# Patient Record
Sex: Male | Born: 1970 | Race: Black or African American | Hispanic: No | Marital: Married
Health system: Southern US, Community
[De-identification: ages and names within clinical notes are randomized; demographics above are authoritative.]

---

## 2003-03-17 ENCOUNTER — Emergency Department (HOSPITAL_COMMUNITY): Admission: EM | Admit: 2003-03-17 | Discharge: 2003-03-17 | Payer: Self-pay | Admitting: Emergency Medicine

## 2013-11-06 ENCOUNTER — Other Ambulatory Visit: Payer: Self-pay | Admitting: Family Medicine

## 2013-11-06 DIAGNOSIS — R109 Unspecified abdominal pain: Secondary | ICD-10-CM

## 2013-11-12 ENCOUNTER — Other Ambulatory Visit: Payer: Self-pay

## 2013-11-14 ENCOUNTER — Ambulatory Visit
Admission: RE | Admit: 2013-11-14 | Discharge: 2013-11-14 | Disposition: A | Discharge status: Home / Self Care | Payer: Federal, State, Local not specified - PPO | Source: Ambulatory Visit | Attending: Family Medicine | Admitting: Family Medicine

## 2013-11-14 DIAGNOSIS — R109 Unspecified abdominal pain: Secondary | ICD-10-CM

## 2015-08-02 ENCOUNTER — Other Ambulatory Visit: Payer: Self-pay | Admitting: Gastroenterology

## 2015-08-02 DIAGNOSIS — R1011 Right upper quadrant pain: Secondary | ICD-10-CM

## 2015-08-16 ENCOUNTER — Ambulatory Visit
Admission: RE | Admit: 2015-08-16 | Discharge: 2015-08-16 | Disposition: A | Discharge status: Home / Self Care | Payer: Federal, State, Local not specified - PPO | Source: Ambulatory Visit | Attending: Gastroenterology | Admitting: Gastroenterology

## 2015-08-16 DIAGNOSIS — R1011 Right upper quadrant pain: Secondary | ICD-10-CM

## 2015-08-16 DIAGNOSIS — K760 Fatty (change of) liver, not elsewhere classified: Secondary | ICD-10-CM | POA: Diagnosis not present

## 2015-08-16 MED ORDER — GADOBENATE DIMEGLUMINE 529 MG/ML IV SOLN
20.0000 mL | Freq: Once | INTRAVENOUS | Status: AC | PRN
  Administered 2015-08-16: 20 mL via INTRAVENOUS

## 2015-09-16 DIAGNOSIS — Z111 Encounter for screening for respiratory tuberculosis: Secondary | ICD-10-CM | POA: Diagnosis not present

## 2015-09-30 DIAGNOSIS — R1011 Right upper quadrant pain: Secondary | ICD-10-CM | POA: Diagnosis not present

## 2015-10-20 DIAGNOSIS — K080 Exfoliation of teeth due to systemic causes: Secondary | ICD-10-CM | POA: Diagnosis not present

## 2015-12-13 DIAGNOSIS — M25562 Pain in left knee: Secondary | ICD-10-CM | POA: Diagnosis not present

## 2015-12-13 DIAGNOSIS — M25561 Pain in right knee: Secondary | ICD-10-CM | POA: Diagnosis not present

## 2015-12-13 DIAGNOSIS — M25572 Pain in left ankle and joints of left foot: Secondary | ICD-10-CM | POA: Diagnosis not present

## 2016-07-05 DIAGNOSIS — E7800 Pure hypercholesterolemia, unspecified: Secondary | ICD-10-CM | POA: Diagnosis not present

## 2016-07-05 DIAGNOSIS — K219 Gastro-esophageal reflux disease without esophagitis: Secondary | ICD-10-CM | POA: Diagnosis not present

## 2016-07-05 DIAGNOSIS — Z0000 Encounter for general adult medical examination without abnormal findings: Secondary | ICD-10-CM | POA: Diagnosis not present

## 2017-01-16 DIAGNOSIS — K080 Exfoliation of teeth due to systemic causes: Secondary | ICD-10-CM | POA: Diagnosis not present

## 2017-01-26 DIAGNOSIS — E7800 Pure hypercholesterolemia, unspecified: Secondary | ICD-10-CM | POA: Diagnosis not present

## 2017-04-20 DIAGNOSIS — H527 Unspecified disorder of refraction: Secondary | ICD-10-CM | POA: Diagnosis not present

## 2017-05-11 DIAGNOSIS — E7800 Pure hypercholesterolemia, unspecified: Secondary | ICD-10-CM | POA: Diagnosis not present

## 2017-07-27 DIAGNOSIS — Z0000 Encounter for general adult medical examination without abnormal findings: Secondary | ICD-10-CM | POA: Diagnosis not present

## 2017-07-27 DIAGNOSIS — E7800 Pure hypercholesterolemia, unspecified: Secondary | ICD-10-CM | POA: Diagnosis not present

## 2017-07-27 DIAGNOSIS — Z23 Encounter for immunization: Secondary | ICD-10-CM | POA: Diagnosis not present

## 2017-08-15 DIAGNOSIS — Z01818 Encounter for other preprocedural examination: Secondary | ICD-10-CM | POA: Diagnosis not present

## 2017-08-15 DIAGNOSIS — Z1211 Encounter for screening for malignant neoplasm of colon: Secondary | ICD-10-CM | POA: Diagnosis not present

## 2017-08-15 DIAGNOSIS — Z800 Family history of malignant neoplasm of digestive organs: Secondary | ICD-10-CM | POA: Diagnosis not present

## 2017-08-30 DIAGNOSIS — M2242 Chondromalacia patellae, left knee: Secondary | ICD-10-CM | POA: Diagnosis not present

## 2017-08-30 DIAGNOSIS — M2241 Chondromalacia patellae, right knee: Secondary | ICD-10-CM | POA: Diagnosis not present

## 2017-09-18 DIAGNOSIS — K640 First degree hemorrhoids: Secondary | ICD-10-CM | POA: Diagnosis not present

## 2017-09-18 DIAGNOSIS — Z800 Family history of malignant neoplasm of digestive organs: Secondary | ICD-10-CM | POA: Diagnosis not present

## 2017-10-02 DIAGNOSIS — M25562 Pain in left knee: Secondary | ICD-10-CM | POA: Diagnosis not present

## 2017-10-04 DIAGNOSIS — M25562 Pain in left knee: Secondary | ICD-10-CM | POA: Diagnosis not present

## 2018-03-12 ENCOUNTER — Other Ambulatory Visit: Payer: Self-pay | Admitting: Family Medicine

## 2018-03-12 ENCOUNTER — Ambulatory Visit
Admission: RE | Admit: 2018-03-12 | Discharge: 2018-03-12 | Disposition: A | Discharge status: Home / Self Care | Payer: Federal, State, Local not specified - PPO | Source: Ambulatory Visit | Attending: Family Medicine | Admitting: Family Medicine

## 2018-03-12 DIAGNOSIS — R062 Wheezing: Secondary | ICD-10-CM

## 2018-03-12 DIAGNOSIS — R059 Cough, unspecified: Secondary | ICD-10-CM

## 2018-03-12 DIAGNOSIS — R05 Cough: Secondary | ICD-10-CM

## 2019-10-06 ENCOUNTER — Emergency Department (HOSPITAL_COMMUNITY): Payer: Managed Care, Other (non HMO)

## 2019-10-06 ENCOUNTER — Other Ambulatory Visit: Payer: Self-pay

## 2019-10-06 ENCOUNTER — Encounter (HOSPITAL_COMMUNITY): Payer: Self-pay | Admitting: Emergency Medicine

## 2019-10-06 ENCOUNTER — Encounter (HOSPITAL_COMMUNITY)
Admission: EM | Disposition: A | Discharge status: Home / Self Care | Payer: Self-pay | Source: Home / Self Care | Attending: Emergency Medicine

## 2019-10-06 ENCOUNTER — Emergency Department (HOSPITAL_COMMUNITY): Payer: Managed Care, Other (non HMO) | Admitting: Certified Registered"

## 2019-10-06 ENCOUNTER — Ambulatory Visit (HOSPITAL_COMMUNITY)
Admission: EM | Admit: 2019-10-06 | Discharge: 2019-10-07 | Disposition: A | Discharge status: Home / Self Care | Payer: Managed Care, Other (non HMO) | Attending: Emergency Medicine | Admitting: Emergency Medicine

## 2019-10-06 DIAGNOSIS — S92424B Nondisplaced fracture of distal phalanx of right great toe, initial encounter for open fracture: Secondary | ICD-10-CM | POA: Insufficient documentation

## 2019-10-06 DIAGNOSIS — S91109A Unspecified open wound of unspecified toe(s) without damage to nail, initial encounter: Secondary | ICD-10-CM | POA: Diagnosis present

## 2019-10-06 DIAGNOSIS — S98131A Complete traumatic amputation of one right lesser toe, initial encounter: Secondary | ICD-10-CM | POA: Diagnosis present

## 2019-10-06 DIAGNOSIS — S92534B Nondisplaced fracture of distal phalanx of right lesser toe(s), initial encounter for open fracture: Secondary | ICD-10-CM | POA: Diagnosis not present

## 2019-10-06 DIAGNOSIS — W28XXXA Contact with powered lawn mower, initial encounter: Secondary | ICD-10-CM | POA: Diagnosis not present

## 2019-10-06 DIAGNOSIS — Z20822 Contact with and (suspected) exposure to covid-19: Secondary | ICD-10-CM | POA: Diagnosis not present

## 2019-10-06 DIAGNOSIS — S92531B Displaced fracture of distal phalanx of right lesser toe(s), initial encounter for open fracture: Secondary | ICD-10-CM

## 2019-10-06 DIAGNOSIS — Z23 Encounter for immunization: Secondary | ICD-10-CM | POA: Insufficient documentation

## 2019-10-06 HISTORY — PX: I & D EXTREMITY: SHX5045

## 2019-10-06 LAB — SARS CORONAVIRUS 2 BY RT PCR (HOSPITAL ORDER, PERFORMED IN ~~LOC~~ HOSPITAL LAB): SARS Coronavirus 2: NEGATIVE

## 2019-10-06 SURGERY — IRRIGATION AND DEBRIDEMENT EXTREMITY
Anesthesia: General | Laterality: Right

## 2019-10-06 MED ORDER — GLYCOPYRROLATE 0.2 MG/ML IJ SOLN
INTRAMUSCULAR | Status: DC | PRN
  Administered 2019-10-06: .2 mg via INTRAVENOUS

## 2019-10-06 MED ORDER — TETANUS-DIPHTH-ACELL PERTUSSIS 5-2.5-18.5 LF-MCG/0.5 IM SUSP
0.5000 mL | Freq: Once | INTRAMUSCULAR | Status: AC
  Administered 2019-10-06: 0.5 mL via INTRAMUSCULAR
  Filled 2019-10-06: qty 0.5

## 2019-10-06 MED ORDER — LIDOCAINE HCL (PF) 1 % IJ SOLN
5.0000 mL | Freq: Once | INTRAMUSCULAR | Status: DC
  Filled 2019-10-06: qty 5

## 2019-10-06 MED ORDER — MIDAZOLAM HCL 2 MG/2ML IJ SOLN
INTRAMUSCULAR | Status: DC | PRN
  Administered 2019-10-06: 2 mg via INTRAVENOUS

## 2019-10-06 MED ORDER — CEFAZOLIN SODIUM-DEXTROSE 2-4 GM/100ML-% IV SOLN
2.0000 g | INTRAVENOUS | Status: AC
  Administered 2019-10-06: 2 g via INTRAVENOUS
  Filled 2019-10-06: qty 100

## 2019-10-06 MED ORDER — TETANUS-DIPHTH-ACELL PERTUSSIS 5-2.5-18.5 LF-MCG/0.5 IM SUSP: 0.5000 mL | Freq: Once | INTRAMUSCULAR | Status: DC

## 2019-10-06 MED ORDER — LACTATED RINGERS IV SOLN: INTRAVENOUS | Status: DC | PRN

## 2019-10-06 MED ORDER — BUPIVACAINE HCL (PF) 0.5 % IJ SOLN
INTRAMUSCULAR | Status: AC
  Filled 2019-10-06: qty 30

## 2019-10-06 SURGICAL SUPPLY — 38 items
BANDAGE ESMARK 6X9 LF (GAUZE/BANDAGES/DRESSINGS) IMPLANT
BNDG CMPR 9X6 STRL LF SNTH (GAUZE/BANDAGES/DRESSINGS) ×1
BNDG COHESIVE 4X5 TAN STRL (GAUZE/BANDAGES/DRESSINGS) ×2 IMPLANT
BNDG CONFORM 3 STRL LF (GAUZE/BANDAGES/DRESSINGS) ×1 IMPLANT
BNDG ESMARK 6X9 LF (GAUZE/BANDAGES/DRESSINGS) ×2
BOOTCOVER CLEANROOM LRG (PROTECTIVE WEAR) ×4 IMPLANT
COVER SURGICAL LIGHT HANDLE (MISCELLANEOUS) ×2 IMPLANT
ELECT REM PT RETURN 9FT ADLT (ELECTROSURGICAL)
ELECTRODE REM PT RTRN 9FT ADLT (ELECTROSURGICAL) IMPLANT
GAUZE SPONGE 4X4 12PLY STRL (GAUZE/BANDAGES/DRESSINGS) ×2 IMPLANT
GAUZE XEROFORM 5X9 LF (GAUZE/BANDAGES/DRESSINGS) ×1 IMPLANT
GLOVE BIOGEL PI IND STRL 6.5 (GLOVE) IMPLANT
GLOVE BIOGEL PI IND STRL 7.5 (GLOVE) IMPLANT
GLOVE BIOGEL PI INDICATOR 6.5 (GLOVE) ×1
GLOVE BIOGEL PI INDICATOR 7.5 (GLOVE) ×1
GLOVE BIOGEL PI ORTHO PRO SZ8 (GLOVE) ×1
GLOVE ORTHO TXT STRL SZ7.5 (GLOVE) ×3 IMPLANT
GLOVE PI ORTHO PRO STRL SZ8 (GLOVE) ×1 IMPLANT
GLOVE SURG SS PI 6.5 STRL IVOR (GLOVE) ×1 IMPLANT
GOWN STRL REUS W/ TWL LRG LVL3 (GOWN DISPOSABLE) IMPLANT
GOWN STRL REUS W/TWL LRG LVL3 (GOWN DISPOSABLE) ×4
KIT BASIN OR (CUSTOM PROCEDURE TRAY) ×2 IMPLANT
KIT TURNOVER KIT B (KITS) ×2 IMPLANT
MANIFOLD NEPTUNE II (INSTRUMENTS) ×2 IMPLANT
NDL HYPO 25GX1X1/2 BEV (NEEDLE) IMPLANT
NEEDLE HYPO 25GX1X1/2 BEV (NEEDLE) ×4 IMPLANT
NS IRRIG 1000ML POUR BTL (IV SOLUTION) ×2 IMPLANT
PACK ORTHO EXTREMITY (CUSTOM PROCEDURE TRAY) ×2 IMPLANT
PAD ARMBOARD 7.5X6 YLW CONV (MISCELLANEOUS) ×4 IMPLANT
SPONGE LAP 18X18 RF (DISPOSABLE) ×2 IMPLANT
STOCKINETTE IMPERVIOUS 9X36 MD (GAUZE/BANDAGES/DRESSINGS) ×2 IMPLANT
SUT ETHILON 4 0 PS 2 18 (SUTURE) ×2 IMPLANT
SYR CONTROL 10ML LL (SYRINGE) ×2 IMPLANT
TOWEL GREEN STERILE (TOWEL DISPOSABLE) ×2 IMPLANT
TOWEL GREEN STERILE FF (TOWEL DISPOSABLE) ×2 IMPLANT
TUBE CONNECTING 12X1/4 (SUCTIONS) ×2 IMPLANT
UNDERPAD 30X36 HEAVY ABSORB (UNDERPADS AND DIAPERS) ×2 IMPLANT
YANKAUER SUCT BULB TIP NO VENT (SUCTIONS) ×2 IMPLANT

## 2019-10-06 NOTE — ED Triage Notes (Addendum)
Patient arrives to ED with complaints of cutting his 2nd, 3rd, and 4th digits on his right foot on a push mower. Patient states he is starting to lose feeling his the toes. Bleeding is controlled.

## 2019-10-06 NOTE — ED Notes (Signed)
Loose bandage placed over wound until exam by EDP, bleeding controlled at this time.

## 2019-10-06 NOTE — Anesthesia Preprocedure Evaluation (Addendum)
Anesthesia Evaluation  Patient identified by MRN, date of birth, ID band Patient awake    Reviewed: Allergy & Precautions, NPO status , Patient's Chart, lab work & pertinent test results  Airway Mallampati: II  TM Distance: >3 FB     Dental   Pulmonary neg pulmonary ROS,    breath sounds clear to auscultation       Cardiovascular negative cardio ROS   Rhythm:Regular Rate:Normal     Neuro/Psych negative neurological ROS     GI/Hepatic negative GI ROS, Neg liver ROS,   Endo/Other  negative endocrine ROS  Renal/GU negative Renal ROS     Musculoskeletal   Abdominal   Peds  Hematology   Anesthesia Other Findings   Reproductive/Obstetrics                             Anesthesia Physical Anesthesia Plan  ASA: II  Anesthesia Plan: MAC   Post-op Pain Management:    Induction: Intravenous  PONV Risk Score and Plan: Ondansetron, Dexamethasone and Midazolam  Airway Management Planned: Nasal Cannula and Simple Face Mask  Additional Equipment:   Intra-op Plan:   Post-operative Plan:   Informed Consent: I have reviewed the patients History and Physical, chart, labs and discussed the procedure including the risks, benefits and alternatives for the proposed anesthesia with the patient or authorized representative who has indicated his/her understanding and acceptance.     Dental advisory given  Plan Discussed with: Anesthesiologist and CRNA  Anesthesia Plan Comments:       Anesthesia Quick Evaluation

## 2019-10-06 NOTE — ED Provider Notes (Signed)
[facility] EMERGENCY DEPARTMENT Provider Note   CSN: [id number] Arrival date & time: 10/06/19  1754     History Chief Complaint  Patient presents with  . Toe Injury    Walter Mcdonald is a 49 y.o. male.  HPI     49yo male presents with concern for foot injury while using a push mower today.  Cut 2nd, 3rd 4th toes on right foot.  Numbness to 2nd and 3rd toes.  Pain worse with palpation> Bleeding controlled> TDap last unknown.   No other concerns.    History reviewed. No pertinent past medical history.  There are no problems to display for this patient.        History reviewed. No pertinent family history.  Social History   Tobacco Use  . Smoking status: Not on file  Substance Use Topics  . Alcohol use: Not on file  . Drug use: Not on file    Home Medications Prior to Admission medications   Not on File    Allergies    Patient has no known allergies.  Review of Systems   Review of Systems  Constitutional: Negative for fever.  Respiratory: Negative for cough and shortness of breath.   Cardiovascular: Negative for chest pain.  Gastrointestinal: Negative for abdominal pain.  Skin: Positive for wound.  Neurological: Positive for numbness.    Physical Exam Updated Vital Signs BP (!) 145/87 (BP Location: Right Arm)   Pulse 87   Temp 98.3 F (36.8 C) (Oral)   Resp 18   Ht 5\' 7"  (1.702 m)   Wt 99.8 kg   SpO2 100%   BMI 34.46 kg/m   Physical Exam Vitals and nursing note reviewed.  Constitutional:      General: He is not in acute distress.    Appearance: Normal appearance. He is not ill-appearing, toxic-appearing or diaphoretic.  HENT:     Head: Normocephalic.  Eyes:     Conjunctiva/sclera: Conjunctivae normal.  Cardiovascular:     Rate and Rhythm: Normal rate and regular rhythm.     Pulses: Normal pulses.  Pulmonary:     Effort: Pulmonary effort is normal. No respiratory distress.  Musculoskeletal:        General:  Tenderness present. No deformity or signs of injury.     Cervical back: No rigidity.     Comments: See photo Numbness to second and third toes Normal sensation 4th and 5th Normal cap refill 2nd toe with partial amputation  Skin:    General: Skin is warm and dry.     Coloration: Skin is not jaundiced or pale.  Neurological:     General: No focal deficit present.     Mental Status: He is alert and oriented to person, place, and time.     ED Results / Procedures / Treatments   Labs (all labs ordered are listed, but only abnormal results are displayed) Labs Reviewed  SARS CORONAVIRUS 2 BY RT PCR (HOSPITAL ORDER, [facility])    EKG None  Radiology DG Foot Complete Right  Result Date: 10/06/2019 CLINICAL DATA:  Status post trauma. EXAM: RIGHT FOOT COMPLETE - 3+ VIEW COMPARISON:  None. FINDINGS: Acute fractures are seen involving the distal phalanges of the second and third right toes, with partial traumatic amputation of the tuft of the distal phalanx of the second right toe. There is suspected dislocation of the D IP joint of the second right toe. There is no evidence of arthropathy or  other focal bone abnormality. Soft tissue defects are seen involving the distal aspects of the second and third right toes. An additional soft tissue deformity of the distal aspect of the right great toe is suspected. IMPRESSION: 1. Acute fractures involving the distal phalanges of the second and third right toes, with partial traumatic amputation of the tuft of the distal phalanx of the second right toe. 2. Probable dislocation of the DIP joint of the second right toe. Electronically Signed   By: Aram Candela M.D.   On: 10/06/2019 19:49    Procedures Procedures (including critical care time)  Medications Ordered in ED Medications  lidocaine (PF) (XYLOCAINE) 1 % injection 5 mL (0 mLs Intradermal Hold 10/06/19 2012)  ceFAZolin (ANCEF) IVPB 2g/100 mL premix (0 g Intravenous  Stopped 10/06/19 2143)  Tdap (BOOSTRIX) injection 0.5 mL (0.5 mLs Intramuscular Given 10/06/19 1955)         ED Course  I have reviewed the triage vital signs and the nursing notes.  Pertinent labs & imaging results that were available during my care of the patient were reviewed by me and considered in my medical decision making (see chart for details).    MDM Rules/Calculators/A&P                      49yo male presents with concern for foot injury while using a push mower today with open fx of second and third toe and partial traumatic amputation of the tuft of the distal phalanx.  Given TDap, ancef.  Declining pain control at this time.   COVID pending. Dr. Dion Saucier of orthopedics consulted, will plan on wash out/repair in OR.     Final Clinical Impression(s) / ED Diagnoses Final diagnoses:  Open displaced fracture of distal phalanx of lesser toe of right foot, initial encounter  Avulsion of toe, initial encounter    Rx / DC Orders ED Discharge Orders    None       Alvira Monday, MD 10/06/19 2259

## 2019-10-06 NOTE — Consult Note (Signed)
ORTHOPAEDIC CONSULTATION  REQUESTING PHYSICIAN: Gareth Morgan, MD  Chief Complaint: Right foot pain  HPI: Walter Mcdonald is a 49 y.o. male who presented to ED with right foot pain. He was mowing his lawn and hit a fence with the lawnmower, it bounced back and ran over his right foot. He cut his 1st, 2nd, and 3rd toes. Pain worse with movement and better at rest. Patient last ate at 11:00 am.    Social History   Socioeconomic History  . Marital status: Married    Spouse name: Not on file  . Number of children: Not on file  . Years of education: Not on file  . Highest education level: Not on file  Occupational History  . Not on file  Tobacco Use  . Smoking status: Not on file  Substance and Sexual Activity  . Alcohol use: Not on file  . Drug use: Not on file  . Sexual activity: Not on file  Other Topics Concern  . Not on file  Social History Narrative  . Not on file   Social Determinants of Health   Financial Resource Strain:   . Difficulty of Paying Living Expenses:   Food Insecurity:   . Worried About Charity fundraiser in the Last Year:   . Arboriculturist in the Last Year:   Transportation Needs:   . Film/video editor (Medical):   Marland Kitchen Lack of Transportation (Non-Medical):   Physical Activity:   . Days of Exercise per Week:   . Minutes of Exercise per Session:   Stress:   . Feeling of Stress :   Social Connections:   . Frequency of Communication with Friends and Family:   . Frequency of Social Gatherings with Friends and Family:   . Attends Religious Services:   . Active Member of Clubs or Organizations:   . Attends Archivist Meetings:   Marland Kitchen Marital Status:    History reviewed. No pertinent family history. No Known Allergies   Positive ROS: All other systems have been reviewed and were otherwise negative with the exception of those mentioned in the HPI and as above.  Physical Exam: General: Alert, no acute distress. Cardiovascular: No  pedal edema Respiratory: No cyanosis, no use of accessory musculature GI: No organomegaly, abdomen is soft and non-tender Skin: Soft tissue wound to first, second, and third toes of right foot, bleeding controlled. Neurologic: Sensation intact distally with the exception of the areas of soft tissue loss Psychiatric: Patient is competent for consent with normal mood and affect. Lymphatic: No axillary or cervical lymphadenopathy  MUSCULOSKELETAL: RLE - EHL and FHL intact without pain. Distal sensation intact to all toes of right foot. Able to move all toes of right foot.   X-ray right foot shows acute fractures of the distal phalanges of the 2nd and third toes with partial traumatic amputation of the distal phalanx of the second toe, possible dislocation of the DIP joint of the second right toe  Assessment/Plan: Acute fractures of distal phalanges of the second and third right toes - irrigation and debridement with revision amputation of toes of right foot - likely can be discharged home after procedure for follow-up outpatient    Ventura Bruns, PA-C Cell [phone]   10/06/2019 11:13 PM   Patient seen and examined, agree with above. Risks benefits and alternatives have been discussed including but not limited the risks of infection, bleeding, nerve injury, nailbed growth disturbance, the need for future revision amputation,  among others. He is willing to proceed.  Teryl Lucy, MD.

## 2019-10-06 NOTE — ED Notes (Signed)
When walked to the pt's room for assessment pt were not on the room, CN start asking this RN where is the pt, CN oriented that I just got to this assignment and I was meeting all the pt on my way to that room, unable to know where is the pt at this time. Green zone RN notified this RN that she took the pt upstair. Unable to see or assess the pt since the pt was gone to OR already.

## 2019-10-07 MED ORDER — CEFAZOLIN SODIUM-DEXTROSE 2-3 GM-%(50ML) IV SOLR
INTRAVENOUS | Status: DC | PRN
  Administered 2019-10-07: 2 g via INTRAVENOUS

## 2019-10-07 MED ORDER — FENTANYL CITRATE (PF) 100 MCG/2ML IJ SOLN: 25.0000 ug | INTRAMUSCULAR | Status: DC | PRN

## 2019-10-07 MED ORDER — HYDROCODONE-ACETAMINOPHEN 5-325 MG PO TABS: 1.0000 | ORAL_TABLET | Freq: Four times a day (QID) | ORAL | 0 refills | Status: AC | PRN

## 2019-10-07 MED ORDER — LIDOCAINE HCL 1 % IJ SOLN
INTRAMUSCULAR | Status: DC | PRN
  Administered 2019-10-06: 40 mg via INTRADERMAL

## 2019-10-07 MED ORDER — PROPOFOL 10 MG/ML IV BOLUS
INTRAVENOUS | Status: DC | PRN
  Administered 2019-10-06: 30 mg via INTRAVENOUS

## 2019-10-07 MED ORDER — HYDROCODONE-ACETAMINOPHEN 5-325 MG PO TABS
1.0000 | ORAL_TABLET | Freq: Once | ORAL | Status: AC
  Administered 2019-10-07: 1 via ORAL

## 2019-10-07 MED ORDER — HYDROCODONE-ACETAMINOPHEN 5-325 MG PO TABS
ORAL_TABLET | ORAL | Status: AC
  Filled 2019-10-07: qty 1

## 2019-10-07 MED ORDER — CEPHALEXIN 500 MG PO CAPS: 500.0000 mg | ORAL_CAPSULE | Freq: Four times a day (QID) | ORAL | 0 refills | Status: AC

## 2019-10-07 MED ORDER — BUPIVACAINE HCL (PF) 0.5 % IJ SOLN
INTRAMUSCULAR | Status: DC | PRN
  Administered 2019-10-07: 30 mL

## 2019-10-07 NOTE — Progress Notes (Signed)
Orthopedic Tech Progress Note Patient Details:  Walter Mcdonald 1970/08/26 [id number]  Ortho Devices Type of Ortho Device: Postop shoe/boot Ortho Device/Splint Location: delivered to or Ortho Device/Splint Interventions: [name] 10/07/2019, 6:53 AM

## 2019-10-07 NOTE — Op Note (Signed)
10/06/2019 - 10/07/2019  12:41 AM  PATIENT:  Walter Mcdonald    PRE-OPERATIVE DIAGNOSIS:    Open right first second and third toe fractures  POST-OPERATIVE DIAGNOSIS:    1.  Grade 2 open great toe fracture, distal phalanx, with partial nail bed destruction 2.  Grade 2 open second toe fracture, distal phalanx, with 90% disruption of nail matrix 3.  Open second toe distal interphalangeal joint disruption 4.  Open third toe distal phalanx fracture with partial nail bed destruction  PROCEDURE:    1.  Irrigation, debridement, with excisional debridement of bone and subcutaneous tissue, great toe 2.  Revision amputation, great toe partial amputation with complex closure of 2 cm laceration 3.  Excisional debridement with removal of bone, subcutaneous tissue, second toe 4.  Nail bed excision, second toe 5.  Distal interphalangeal joint arthrotomy irrigation, second toe 6.  Revision amputation, second toe 7.  Excisional debridement, skin, subcutaneous tissue, bone, third toe and partial nailbed matrix.  SURGEON:  Johnny Bridge, MD  PHYSICIAN ASSISTANT: Merlene Pulling, PA-C, present and scrubbed throughout the case, critical for completion in a timely fashion, and for retraction, instrumentation, and closure.  ANESTHESIA:   Local monitored anesthesia care  PREOPERATIVE INDICATIONS:  Walter Mcdonald is a  49 y.o. male who ran over his right foot with a lawnmower and elected for surgical management.    The risks benefits and alternatives were discussed with the patient preoperatively including but not limited to the risks of infection, bleeding, nerve injury, cardiopulmonary complications, the need for revision surgery, among others, and the patient was willing to proceed.  ESTIMATED BLOOD LOSS: 25 mL  OPERATIVE IMPLANTS: None  OPERATIVE FINDINGS: The great toe had lost the ulnar side of the distal phalanx as well as some of the nailbed.  The second toe had a complex open fracture with  complete destruction of the distal phalanx, open arthrotomy of the DIP joint, with loss of the connection of the flexor tendon to any functional bone at the distal phalanx.  The third toe had injury to the ulnar side of the distal phalanx as well, with some disruption of the nailbed.  OPERATIVE PROCEDURE: The patient was brought to the operating room and placed in the supine position.  Local monitored anesthesia care was performed.  I injected a total of 20 mL into the webspaces of the foot to achieve satisfactory anesthesia of the involved toes.  The lower extremity was prepped and draped using Betadine scrub and paint.  After satisfactory anesthesia and prep and drape was performed, a timeout was completed, and I started with the excisional debridement.  I removed any loose bits of bone from all 3 toes, using a scissors, and I had to use a knife blade to remove the destroyed pieces that were still attached within the second distal phalanx, particularly to the flexor tendon.  After cleaning out all of the destroyed bone I also used a knife to excise the nail matrix proximally on the second toe completely.  I then irrigated a total of 6 L of fluid through all of the wounds.  I repaired the skin with 3-0 nylon suture revising the amputation on all of the toes, the most substantial was the second toe which I had to excise some of the skin in order to get an appropriate length and not have a completely floppy toe, given the fact that there was no bony architecture left in the distal phalanx on that toe.  After all of the amputations have been revised, the wounds were dressed with sterile gauze, and a hard sole shoe applied.  He tolerated the procedure well and there were no complications.  He will be discharged on oral antibiotics with follow-up with me in 1 week.

## 2019-10-07 NOTE — Transfer of Care (Signed)
Immediate Anesthesia Transfer of Care Note  Patient: Walter Mcdonald  Procedure(s) Performed: IRRIGATION AND DEBRIDEMENT TOES (Right )  Patient Location: PACU  Anesthesia Type:General  Level of Consciousness: awake, alert , oriented and patient cooperative  Airway & Oxygen Therapy: Patient Spontanous Breathing  Post-op Assessment: Report given to RN, Post -op Vital signs reviewed and stable and Patient moving all extremities X 4  Post vital signs: Reviewed and stable  Last Vitals:  Vitals Value Taken Time  BP 150/108 10/07/19 0052  Temp    Pulse    Resp 17 10/07/19 0053  SpO2    Vitals shown include unvalidated device data.  Last Pain:  Vitals:   10/06/19 2209  TempSrc: Oral  PainSc:       Patients Stated Pain Goal: 0 (10/06/19 1841)  Complications: No apparent anesthesia complications

## 2019-10-07 NOTE — Anesthesia Postprocedure Evaluation (Signed)
Anesthesia Post Note  Patient: Walter Mcdonald  Procedure(s) Performed: IRRIGATION AND DEBRIDEMENT TOES (Right )     Patient location during evaluation: PACU Anesthesia Type: General Level of consciousness: awake Pain management: pain level controlled Vital Signs Assessment: post-procedure vital signs reviewed and stable Respiratory status: spontaneous breathing Cardiovascular status: stable Postop Assessment: no apparent nausea or vomiting Anesthetic complications: no    Last Vitals:  Vitals:   10/07/19 0115 10/07/19 0124  BP:  (!) 140/99  Pulse: (!) 101 85  Resp: (!) 28 19  Temp:  36.6 C  SpO2: 96% 98%    Last Pain:  Vitals:   10/07/19 0124  TempSrc:   PainSc: 5                  Broxton Broady

## 2019-10-07 NOTE — Discharge Instructions (Signed)

## 2021-06-24 IMAGING — CR DG FOOT COMPLETE 3+V*R*
3 series · 3 of 3 positions shown · non-contrast
Comparison: None.

CLINICAL DATA: Status post trauma.

EXAM:
RIGHT FOOT COMPLETE - 3+ VIEW

[foot ap]
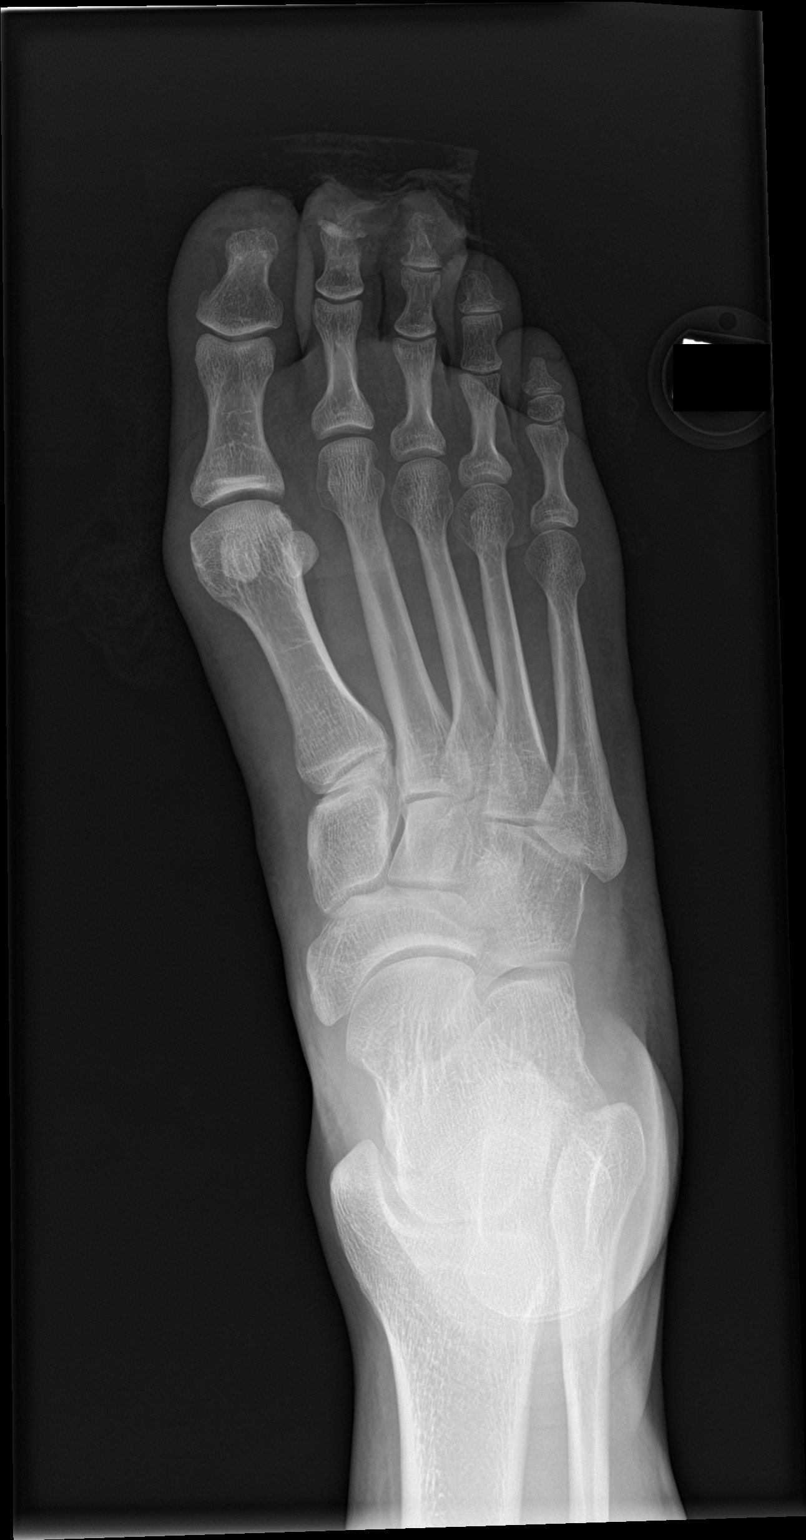

[foot obl]
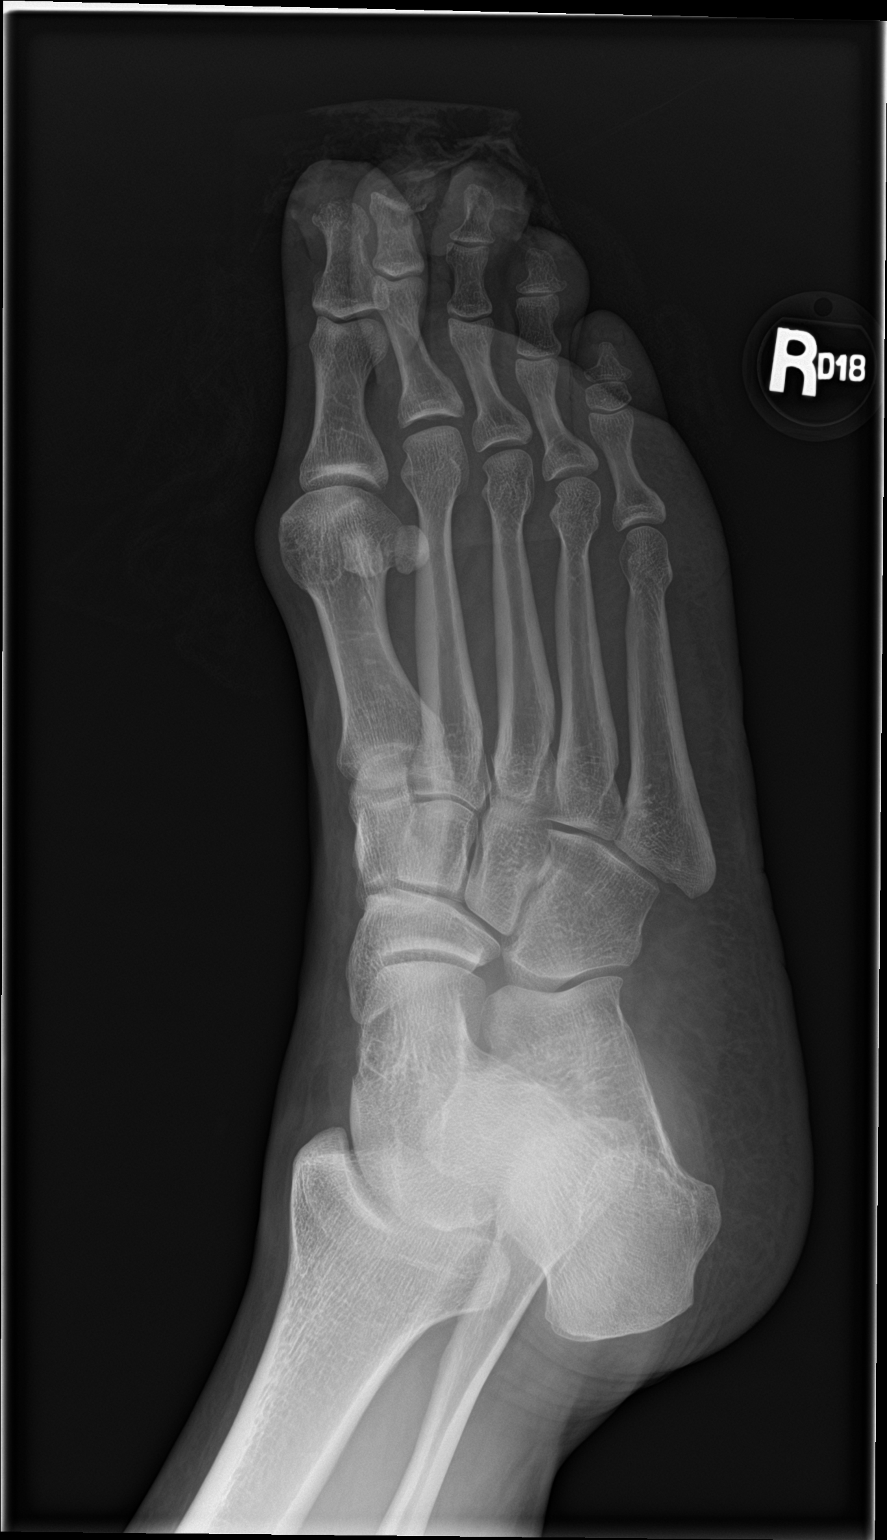

[foot lat]
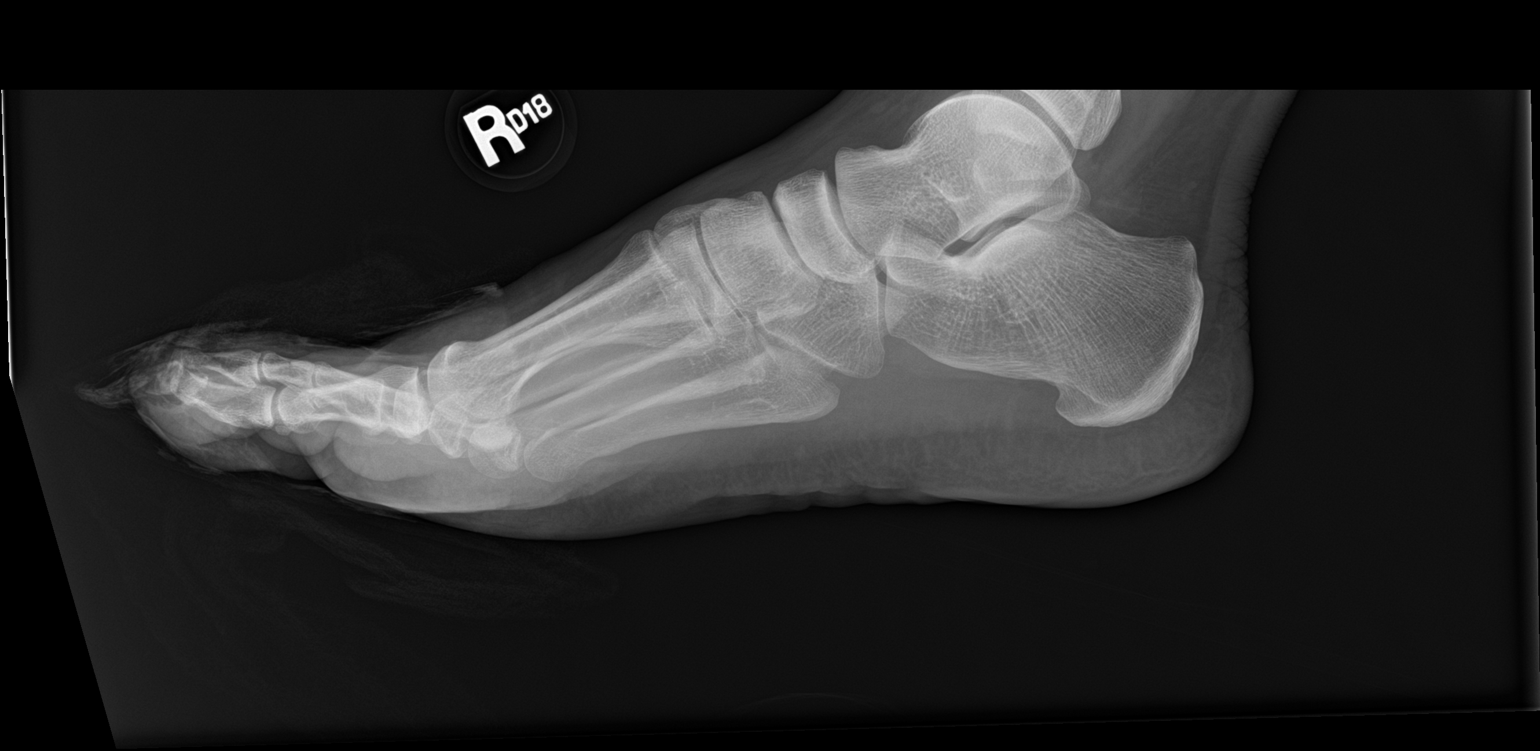

[3 of 3 positions shown; findings below may reference images not displayed]

FINDINGS: Acute fractures are seen involving the distal phalanges of the
second and third right toes, with partial traumatic amputation of
the tuft of the distal phalanx of the second right toe. There is
suspected dislocation of the D IP joint of the second right toe.
There is no evidence of arthropathy or other focal bone abnormality.
Soft tissue defects are seen involving the distal aspects of the
second and third right toes. An additional soft tissue deformity of
the distal aspect of the right great toe is suspected.
IMPRESSION: 1. Acute fractures involving the distal phalanges of the second and
third right toes, with partial traumatic amputation of the tuft of
the distal phalanx of the second right toe.
2. Probable dislocation of the DIP joint of the second right toe.
